# Patient Record
Sex: Female | Born: 1935 | Race: White | Hispanic: No | Marital: Married
Health system: Southern US, Community
[De-identification: ages and names within clinical notes are randomized; demographics above are authoritative.]

---

## 2005-02-22 ENCOUNTER — Ambulatory Visit: Payer: Self-pay

## 2006-07-18 ENCOUNTER — Ambulatory Visit: Payer: Self-pay | Admitting: Ophthalmology

## 2006-08-09 ENCOUNTER — Ambulatory Visit: Payer: Self-pay | Admitting: Specialist

## 2006-08-17 ENCOUNTER — Ambulatory Visit: Payer: Self-pay | Admitting: Specialist

## 2006-08-30 ENCOUNTER — Ambulatory Visit: Payer: Self-pay | Admitting: Specialist

## 2007-07-31 ENCOUNTER — Ambulatory Visit: Payer: Self-pay | Admitting: Ophthalmology

## 2007-11-03 ENCOUNTER — Emergency Department: Payer: Self-pay | Admitting: Emergency Medicine

## 2007-12-05 ENCOUNTER — Encounter: Payer: Self-pay | Admitting: Orthopedic Surgery

## 2007-12-19 ENCOUNTER — Encounter: Payer: Self-pay | Admitting: Orthopedic Surgery

## 2008-05-14 ENCOUNTER — Ambulatory Visit: Payer: Self-pay | Admitting: Family Medicine

## 2008-07-01 ENCOUNTER — Ambulatory Visit: Payer: Self-pay | Admitting: Gastroenterology

## 2008-10-19 ENCOUNTER — Ambulatory Visit: Payer: Self-pay | Admitting: Gastroenterology

## 2009-12-31 ENCOUNTER — Ambulatory Visit: Payer: Self-pay | Admitting: Specialist

## 2010-05-12 ENCOUNTER — Ambulatory Visit: Payer: Self-pay | Admitting: Gastroenterology

## 2018-01-07 ENCOUNTER — Other Ambulatory Visit: Payer: Self-pay | Admitting: Student

## 2018-01-07 ENCOUNTER — Ambulatory Visit
Admission: RE | Admit: 2018-01-07 | Discharge: 2018-01-07 | Disposition: A | Discharge status: Home / Self Care | Payer: Medicare Other | Source: Ambulatory Visit | Attending: Student | Admitting: Student

## 2018-01-07 DIAGNOSIS — S76312A Strain of muscle, fascia and tendon of the posterior muscle group at thigh level, left thigh, initial encounter: Secondary | ICD-10-CM | POA: Insufficient documentation

## 2018-01-07 DIAGNOSIS — X58XXXA Exposure to other specified factors, initial encounter: Secondary | ICD-10-CM | POA: Diagnosis not present

## 2018-01-07 DIAGNOSIS — M1612 Unilateral primary osteoarthritis, left hip: Secondary | ICD-10-CM | POA: Insufficient documentation

## 2018-01-07 DIAGNOSIS — M67952 Unspecified disorder of synovium and tendon, left thigh: Secondary | ICD-10-CM | POA: Diagnosis not present

## 2018-05-16 IMAGING — MR MR HIP*L* W/O CM
6 series · 38 of 40 positions shown · non-contrast
Comparison: None.

CLINICAL DATA: Posterior left hip pain with bruising of the left
proximal thigh. No known injury.

EXAM:
MR OF THE LEFT HIP WITHOUT CONTRAST
TECHNIQUE: Multiplanar, multisequence MR imaging was performed. No intravenous
contrast was administered.

[Series 3: T1 · coronal · 4.0mm · 1.56mm/px · 7 of 40 slices shown (1 of 2)]
[im 1/40]
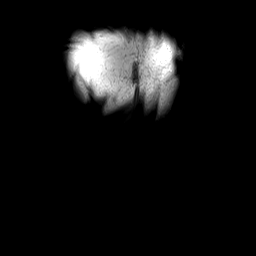
[im 7/40]
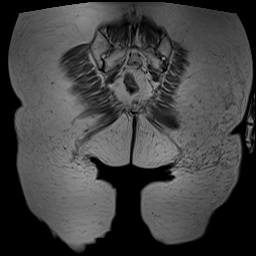
[im 14/40]
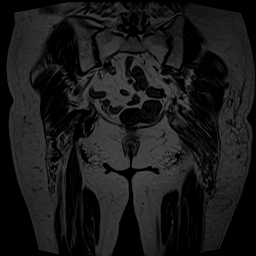
[im 20/40]
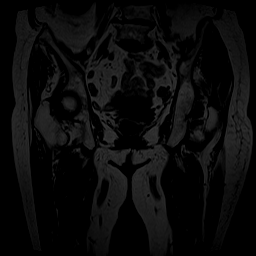
[im 27/40]
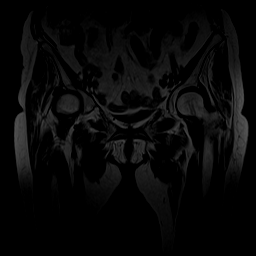
[im 33/40]
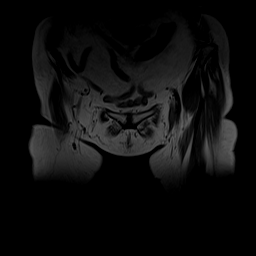
[im 40/40]
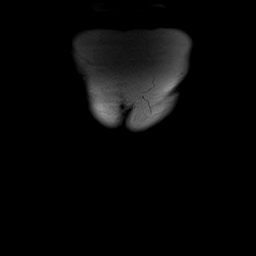

[Series 4: STIR · coronal · 4.0mm · 0.78mm/px · 5 of 40 slices shown]
[im 1/40]
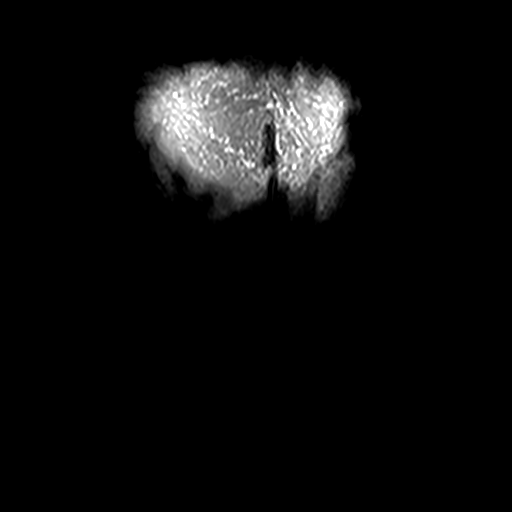
[im 7/40]
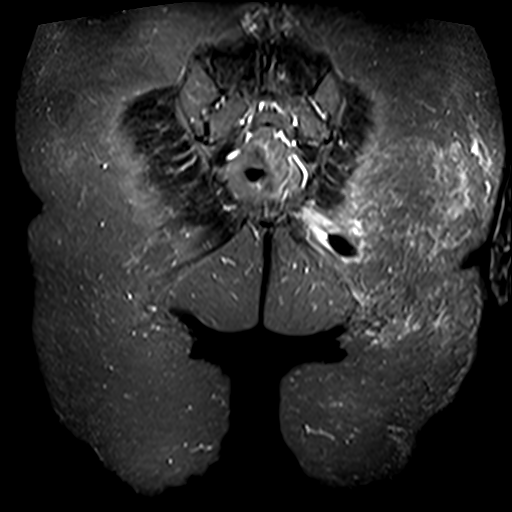
[im 14/40]
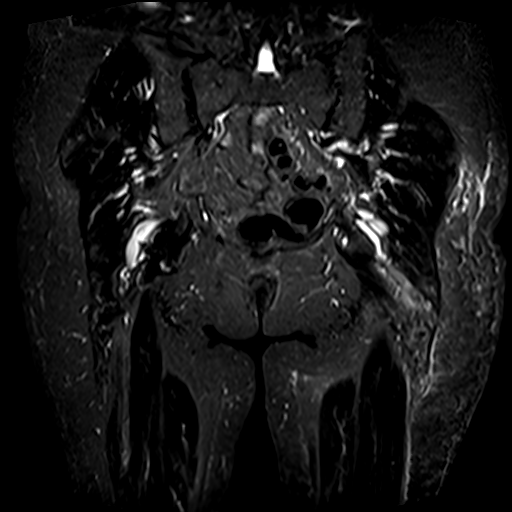
[im 20/40]
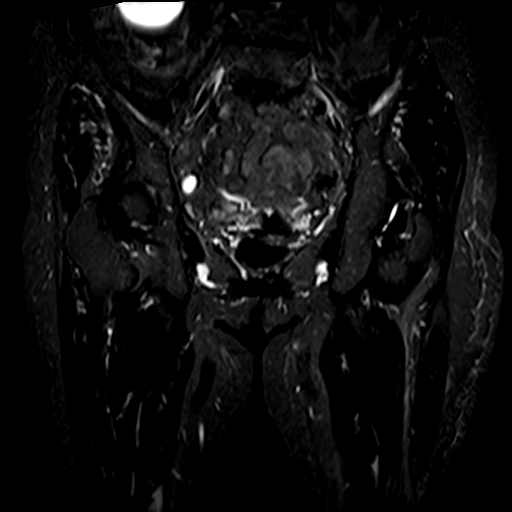
[im 27/40]
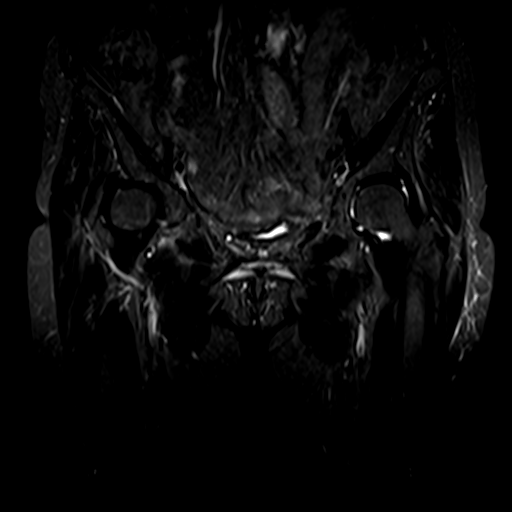

[Series 5: T1 · axial · 4.0mm · 1.48mm/px · z∈[-183,+87]mm · 8 of 55 slices shown (2 of 2)]
[im 1/55]
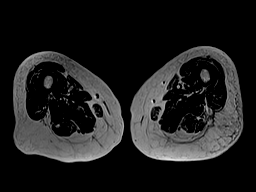
[im 8/55]
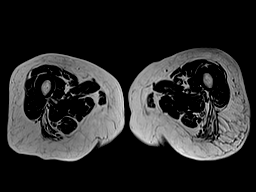
[im 16/55]
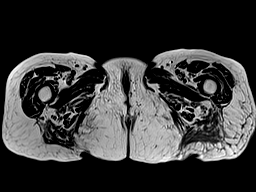
[im 24/55]
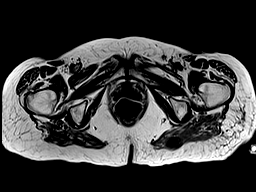
[im 31/55]
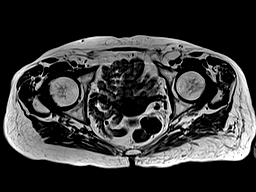
[im 39/55]
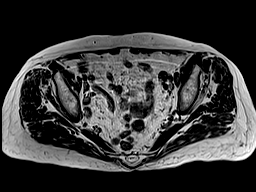
[im 47/55]
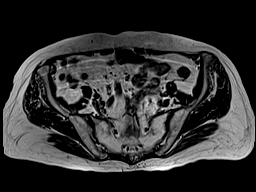
[im 55/55]
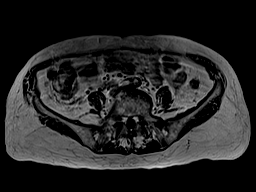

[Series 6: T2 fat-sat · axial · 4.0mm · 0.74mm/px · z∈[-183,+87]mm · 8 of 55 slices shown (1 of 2)]
[im 1/55]
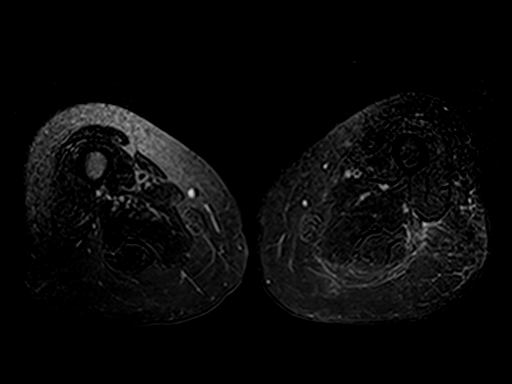
[im 8/55]
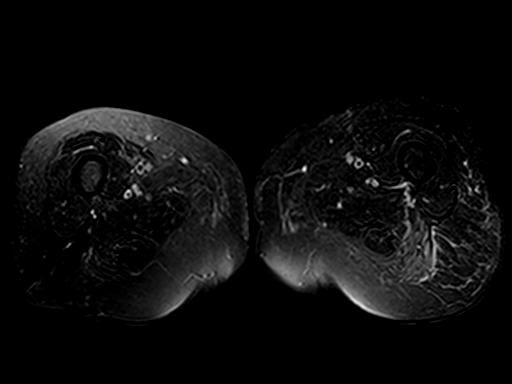
[im 16/55]
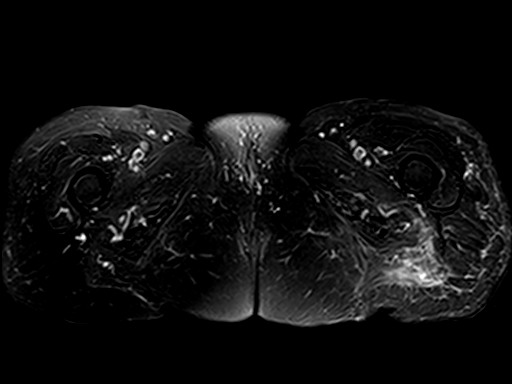
[im 24/55]
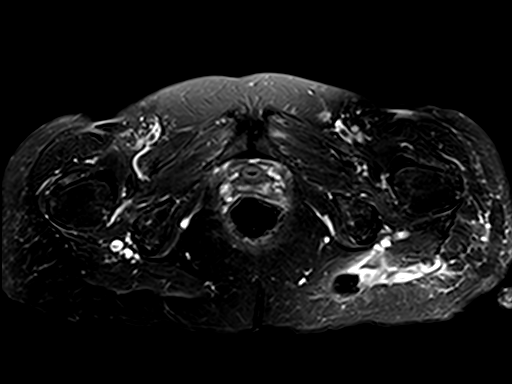
[im 31/55]
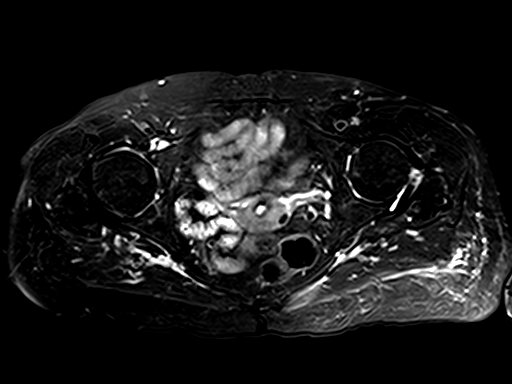
[im 39/55]
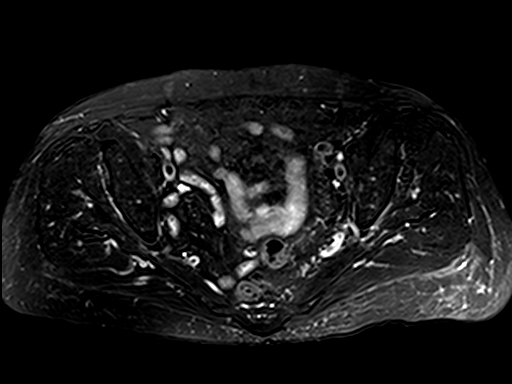
[im 47/55]
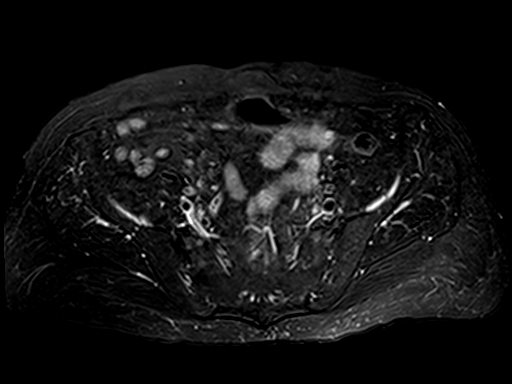
[im 55/55]
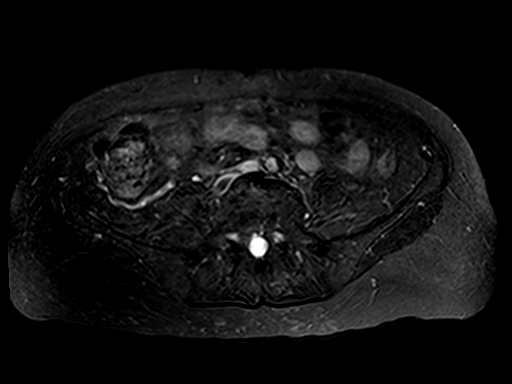

[Series 7: T2 fat-sat · sagittal · 5.0mm · 1.41mm/px · 5 of 35 slices shown (2 of 2)]
[im 1/35]
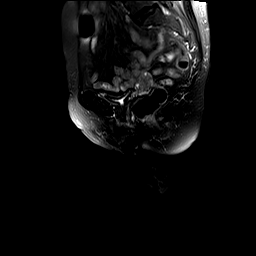
[im 9/35]
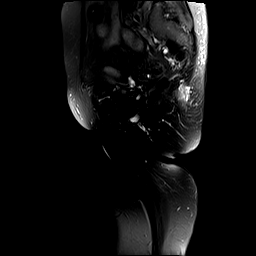
[im 18/35]
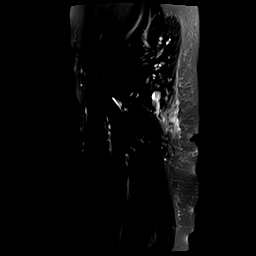
[im 26/35]
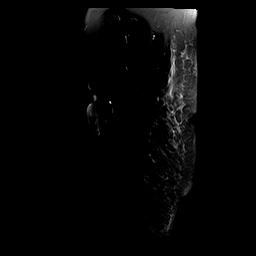
[im 35/35]
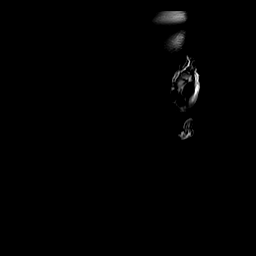

[Series 8: PD fat-sat · sagittal · 4.0mm · 0.70mm/px · 5 of 35 slices shown]
[im 1/35]
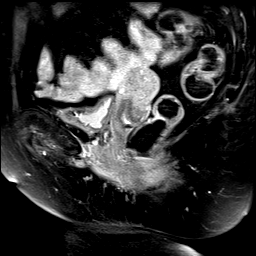
[im 9/35]
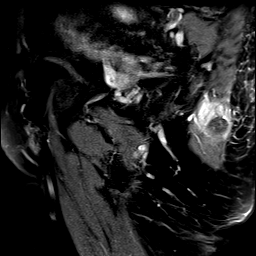
[im 18/35]
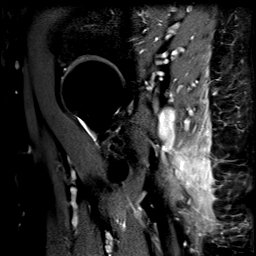
[im 26/35]
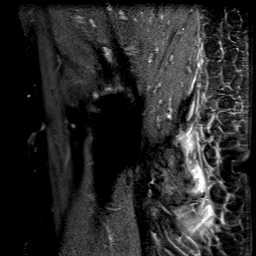
[im 35/35]
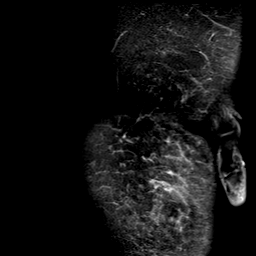

[38 of 40 positions shown; findings below may reference images not displayed]

FINDINGS: Bones: There is no evidence of acute fracture, dislocation or
avascular necrosis. The visualized bony pelvis appears normal. The
visualized sacroiliac joints and symphysis pubis appear normal.

Articular cartilage and labrum

Articular cartilage: Mild partial-thickness cartilage loss in the
left hip joint. No subchondral signal abnormality identified.

Labrum: Degeneration of the left anterior superior labrum.

Joint or bursal effusion

Joint effusion: No significant hip joint effusion.

Bursae: No focal periarticular fluid collection.

Muscles and tendons

Muscles and tendons: There is abnormal edema within the proximal
inferior left gluteus maximus muscle, with a 3.5 x 1.8 x 3.2 cm (AP
by transverse by CC) T2 hypointense intramuscular fluid collection
with heterogeneous T1 signal, including areas of T1 hyperintensity.

The bilateral gluteal tendons are unremarkable. Mild bilateral
hamstring tendinosis. The iliopsoas tendons appear normal. The
piriformis muscles appear symmetric.

Other findings

Miscellaneous: Sigmoid diverticulosis. The visualized internal
pelvic contents otherwise appear unremarkable. Partially visualized
large right renal cyst.
IMPRESSION: 1. Findings consistent with high-grade muscle injury of the proximal
inferior left gluteus maximus muscle. 3.5 cm predominantly
hypointense lesion within the muscle most likely represents an
intramuscular hematoma given the clinical history, although a
hemorrhagic mass cannot be entirely excluded in the absence of
intravenous contrast. Clinical follow-up to resolution is
recommended.
2. Mild bilateral hamstring tendinosis without evidence of acute
injury.
3. Mild left hip osteoarthritis.  No acute osseous abnormality.

## 2022-03-29 ENCOUNTER — Emergency Department
Admission: EM | Admit: 2022-03-29 | Discharge: 2022-03-29 | Discharge status: Against Medical Advice | Payer: Medicare Other | Attending: Emergency Medicine | Admitting: Emergency Medicine

## 2022-03-29 DIAGNOSIS — S51811A Laceration without foreign body of right forearm, initial encounter: Secondary | ICD-10-CM | POA: Diagnosis not present

## 2022-03-29 DIAGNOSIS — S51012A Laceration without foreign body of left elbow, initial encounter: Secondary | ICD-10-CM | POA: Insufficient documentation

## 2022-03-29 DIAGNOSIS — S59911A Unspecified injury of right forearm, initial encounter: Secondary | ICD-10-CM | POA: Diagnosis present

## 2022-03-29 DIAGNOSIS — W19XXXA Unspecified fall, initial encounter: Secondary | ICD-10-CM | POA: Diagnosis not present

## 2022-03-29 NOTE — ED Triage Notes (Signed)
Pt suffered a mechanical fall tonight at home. Pt did not hit her head per EMS and suffered a skin tear to the right Forearm and left elbow.  ?

## 2022-03-29 NOTE — ED Triage Notes (Signed)
Denies any LOC or any pain. ?

## 2022-04-17 DEATH — deceased
